# Patient Record
Sex: Female | Born: 2000 | Race: White | Hispanic: No | Marital: Single | Smoking: Never smoker
Health system: Southern US, Community
[De-identification: ages and names within clinical notes are randomized; demographics above are authoritative.]

## PROBLEM LIST (undated history)

## (undated) DIAGNOSIS — R42 Dizziness and giddiness: Secondary | ICD-10-CM

## (undated) DIAGNOSIS — D34 Benign neoplasm of thyroid gland: Secondary | ICD-10-CM

## (undated) DIAGNOSIS — E079 Disorder of thyroid, unspecified: Secondary | ICD-10-CM

## (undated) HISTORY — PX: BIOPSY THYROID: PRO38

---
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
## 2019-01-07 ENCOUNTER — Ambulatory Visit (INDEPENDENT_AMBULATORY_CARE_PROVIDER_SITE_OTHER)
Admission: EM | Admit: 2019-01-07 | Discharge: 2019-01-07 | Disposition: A | Discharge status: Home / Self Care | Payer: Managed Care, Other (non HMO) | Source: Home / Self Care

## 2019-01-07 ENCOUNTER — Emergency Department (HOSPITAL_COMMUNITY)
Admission: EM | Admit: 2019-01-07 | Discharge: 2019-01-07 | Discharge status: Against Medical Advice | Payer: Managed Care, Other (non HMO) | Attending: Emergency Medicine | Admitting: Emergency Medicine

## 2019-01-07 ENCOUNTER — Encounter (HOSPITAL_COMMUNITY): Payer: Self-pay | Admitting: Emergency Medicine

## 2019-01-07 ENCOUNTER — Other Ambulatory Visit: Payer: Self-pay

## 2019-01-07 ENCOUNTER — Encounter (HOSPITAL_COMMUNITY): Payer: Self-pay

## 2019-01-07 DIAGNOSIS — R0789 Other chest pain: Secondary | ICD-10-CM

## 2019-01-07 DIAGNOSIS — Z20828 Contact with and (suspected) exposure to other viral communicable diseases: Secondary | ICD-10-CM

## 2019-01-07 DIAGNOSIS — E079 Disorder of thyroid, unspecified: Secondary | ICD-10-CM | POA: Insufficient documentation

## 2019-01-07 DIAGNOSIS — R070 Pain in throat: Secondary | ICD-10-CM

## 2019-01-07 DIAGNOSIS — J069 Acute upper respiratory infection, unspecified: Secondary | ICD-10-CM | POA: Insufficient documentation

## 2019-01-07 DIAGNOSIS — J029 Acute pharyngitis, unspecified: Secondary | ICD-10-CM | POA: Diagnosis present

## 2019-01-07 DIAGNOSIS — Z5321 Procedure and treatment not carried out due to patient leaving prior to being seen by health care provider: Secondary | ICD-10-CM | POA: Diagnosis not present

## 2019-01-07 HISTORY — DX: Dizziness and giddiness: R42

## 2019-01-07 HISTORY — DX: Benign neoplasm of thyroid gland: D34

## 2019-01-07 LAB — POCT RAPID STREP A: Streptococcus, Group A Screen (Direct): NEGATIVE

## 2019-01-07 MED ORDER — PROMETHAZINE-DM 6.25-15 MG/5ML PO SYRP: 5.0000 mL | ORAL_SOLUTION | Freq: Three times a day (TID) | ORAL | 0 refills | Status: DC | PRN

## 2019-01-07 MED ORDER — BENZONATATE 100 MG PO CAPS: 100.0000 mg | ORAL_CAPSULE | Freq: Three times a day (TID) | ORAL | 0 refills | Status: DC | PRN

## 2019-01-07 NOTE — ED Triage Notes (Signed)
Pt reports sore throat bodyaches for the last week. Reportd sick contacts at home. VSS

## 2019-01-07 NOTE — ED Triage Notes (Signed)
Patient presents to Urgent Care with complaints of sore throat since last night. Patient reports she has also been having generalized body aches.

## 2019-01-07 NOTE — ED Provider Notes (Signed)
MRN: [id number] DOB: [birth year]  Subjective:   Lindsey Soto is a 18 y.o. female presenting for 1 day hx of acute onset mild-moderate worsening malaise, throat pain with difficulty swallowing. Has tried ibuprofen without any relief. Denies any COVID 19 contacts. Denies smoking cigarettes. Denies hx of asthma, allergies.  Of note, patient did have contact with 2 family members that her babies.  They reported to the ER were diagnosed clinically with croup, no COVID-19 testing was done.  No other testing was done per patient.  Patient works as a Engineer, maintenance (IT) and has had a lot of exposure to the general public lately.  No current facility-administered medications for this encounter.   Current Outpatient Medications:  .  meclizine (ANTIVERT) 25 MG tablet, Take 1 tablet (25 mg total) by mouth 3 (three) times daily as needed for dizziness., Disp: 15 tablet, Rfl: 0   No Known Allergies  Past Medical History:  Diagnosis Date  . Thyroid disease   . Thyroid tumor, benign   . Vertigo      Past Surgical History:  Procedure Laterality Date  . BIOPSY THYROID      Review of Systems  Constitutional: Positive for malaise/fatigue. Negative for fever.  HENT: Positive for sore throat. Negative for congestion, ear pain and sinus pain.   Eyes: Negative for blurred vision, double vision, discharge and redness.  Respiratory: Positive for cough. Negative for hemoptysis, shortness of breath and wheezing.   Cardiovascular: Positive for chest pain (rib pain with deep breath and coughing).  Gastrointestinal: Negative for abdominal pain, diarrhea, nausea and vomiting.  Genitourinary: Negative for dysuria, flank pain and hematuria.  Musculoskeletal: Positive for myalgias.  Skin: Negative for rash.  Neurological: Negative for dizziness, weakness and headaches.  Psychiatric/Behavioral: Negative for depression and substance abuse.    Objective:   Vitals: BP 110/75 (BP Location: Right Arm)    Pulse 74   Temp 98.6 F (37 C) (Oral)   Resp 17   LMP 12/24/2018 Comment: Nexplanon plced 2018  SpO2 99%   Physical Exam Constitutional:      General: She is not in acute distress.    Appearance: Normal appearance. She is well-developed. She is not ill-appearing, toxic-appearing or diaphoretic.  HENT:     Head: Normocephalic and atraumatic.     Right Ear: Tympanic membrane and ear canal normal. No drainage or tenderness. No middle ear effusion. Tympanic membrane is not erythematous.     Left Ear: Tympanic membrane and ear canal normal. No drainage or tenderness.  No middle ear effusion. Tympanic membrane is not erythematous.     Nose: Congestion and rhinorrhea present.     Mouth/Throat:     Mouth: Mucous membranes are moist. No oral lesions.     Pharynx: No pharyngeal swelling, oropharyngeal exudate, posterior oropharyngeal erythema or uvula swelling.     Tonsils: No tonsillar exudate or tonsillar abscesses.  Eyes:     Extraocular Movements: Extraocular movements intact.     Right eye: Normal extraocular motion.     Left eye: Normal extraocular motion.     Conjunctiva/sclera: Conjunctivae normal.     Pupils: Pupils are equal, round, and reactive to light.  Neck:     Musculoskeletal: Normal range of motion and neck supple.  Cardiovascular:     Rate and Rhythm: Normal rate and regular rhythm.     Pulses: Normal pulses.     Heart sounds: Normal heart sounds. No murmur. No friction rub. No gallop.   Pulmonary:  Effort: Pulmonary effort is normal. No respiratory distress.     Breath sounds: Normal breath sounds. No stridor. No wheezing, rhonchi or rales.  Lymphadenopathy:     Cervical: No cervical adenopathy.  Skin:    General: Skin is warm and dry.     Findings: No rash.  Neurological:     General: No focal deficit present.     Mental Status: She is alert and oriented to person, place, and time.  Psychiatric:        Mood and Affect: Mood normal.        Behavior: Behavior  normal.        Thought Content: Thought content normal.     Negative rapid strep by verbal report.    Assessment and Plan :   1. Viral URI with cough   2. Atypical chest pain   3. Throat pain     Will manage for viral illness. Counseled patient on nature of COVID-19 including modes of transmission, diagnostic testing, management and supportive care.  Offered symptomatic relief. COVID 19 testing is pending. Counseled patient on potential for adverse effects with medications prescribed/recommended today, ER and return-to-clinic precautions discussed, patient verbalized understanding.     Jaynee Eagles, [place] 01/07/19 1649

## 2019-01-07 NOTE — Discharge Instructions (Signed)
We will manage this as a viral syndrome. For sore throat or cough try using a honey-based tea. Use 3 teaspoons of honey with juice squeezed from half lemon. Place shaved pieces of ginger into 1/2-1 cup of water and warm over stove top. Then mix the ingredients and repeat every 4 hours as needed. Please take Tylenol 500mg  every 6 hours. Hydrate very well with at least 2 liters of water. Eat light meals such as soups to replenish electrolytes and soft fruits, veggies. Start an antihistamine like Zyrtec, Allegra or Claritin for postnasal drainage, sinus congestion.  You can take this together with pseudoephedrine (Sudafed) at a dose of 60 mg 3 times a day oral 120 mg twice daily as needed for the same kind of congestion.

## 2019-01-07 NOTE — ED Notes (Signed)
Pt wants to leave. Encouraged to stay and see a provider. Asked to stay. Pt refuses wants to go elsewhere. Labels and wristband retrieved.

## 2019-01-09 LAB — CULTURE, GROUP A STREP (THRC)

## 2019-01-10 LAB — NOVEL CORONAVIRUS, NAA (HOSP ORDER, SEND-OUT TO REF LAB; TAT 18-24 HRS): SARS-CoV-2, NAA: NOT DETECTED

## 2019-04-03 IMAGING — CT CT HEAD W/O CM
3 series · 15 of 47 positions shown, 18 images · non-contrast
Comparison: None.

CLINICAL DATA: Ataxia.

EXAM:
CT HEAD WITHOUT CONTRAST
TECHNIQUE: Contiguous axial images were obtained from the base of the skull
through the vertex without intravenous contrast.

[Series 2: head wo · axial · 0.40mm/px · z∈[-159,-34]mm · 9 of 30 slices shown, 12 images]
[im 3/30  brain]
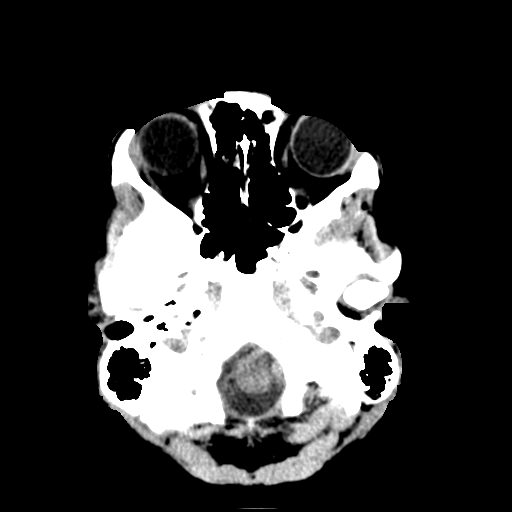
[im 3/30  bone]
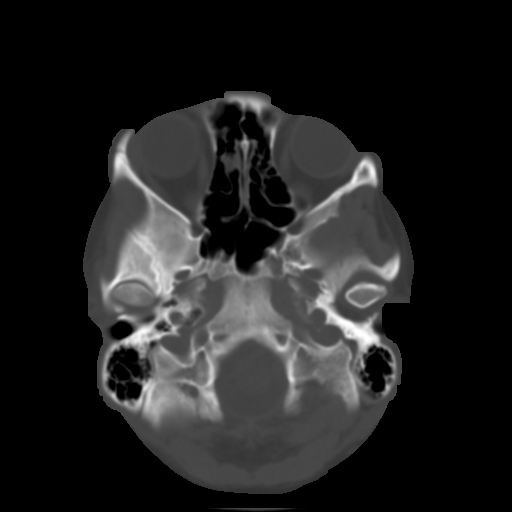
[im 6/30  brain]
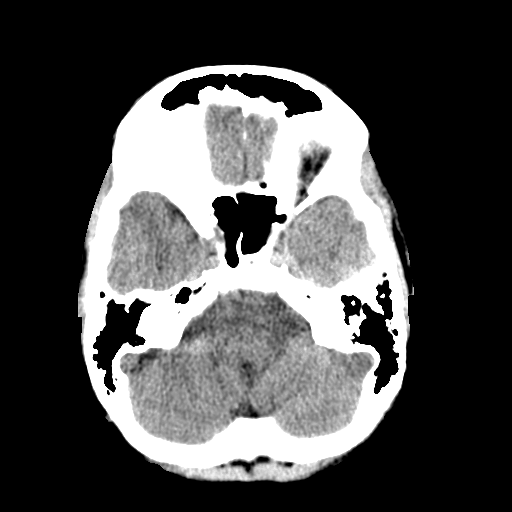
[im 9/30  brain]
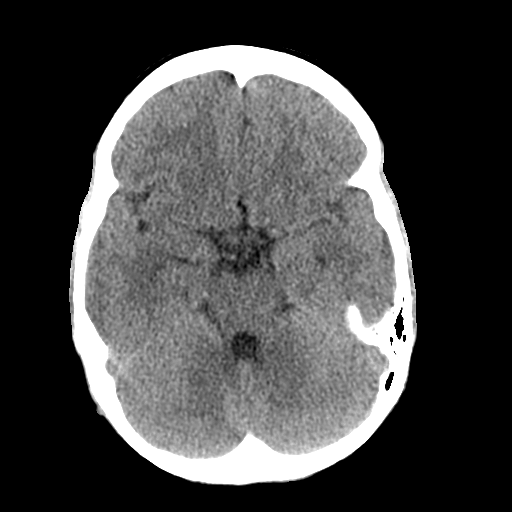
[im 12/30  brain]
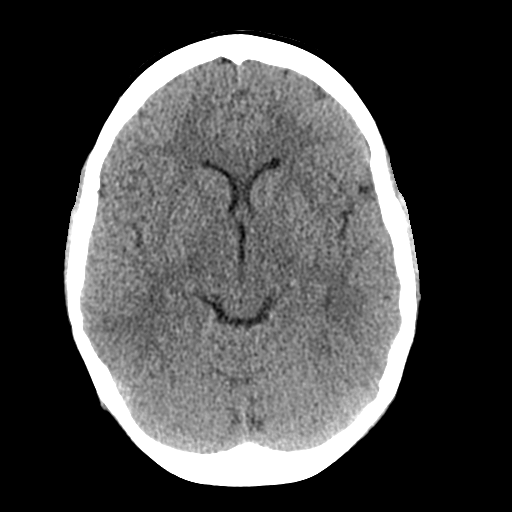
[im 16/30  brain]
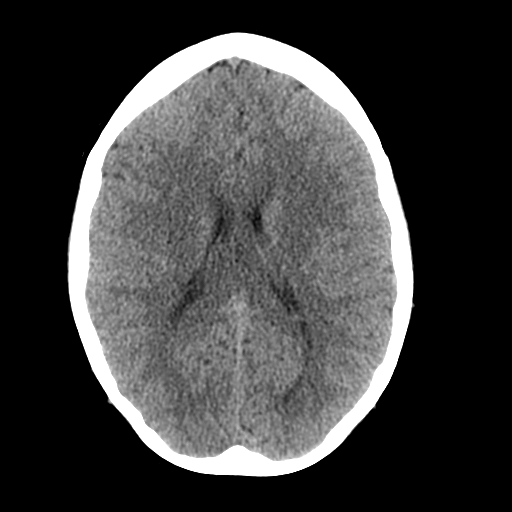
[im 16/30  bone]
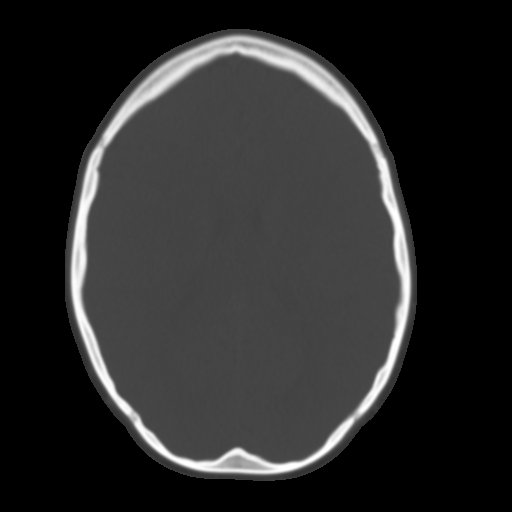
[im 19/30  brain]
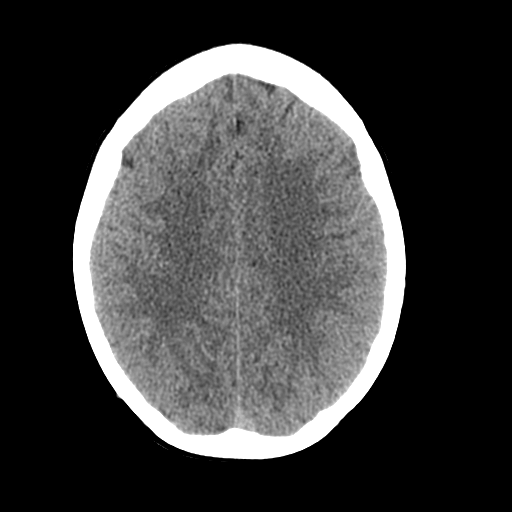
[im 22/30  brain]
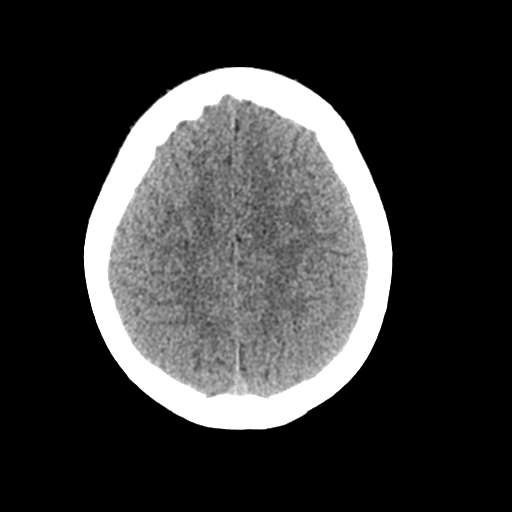
[im 25/30  brain]
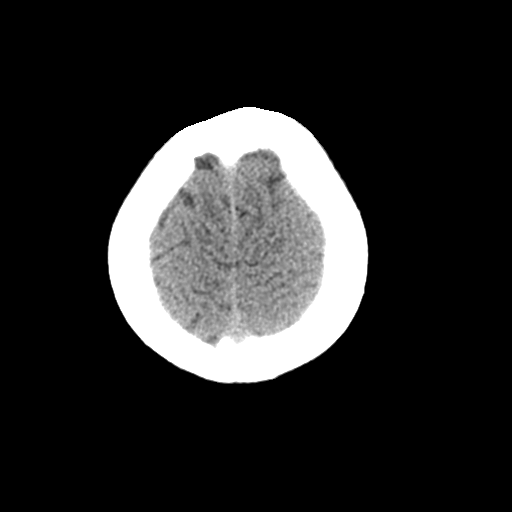
[im 28/30  brain]
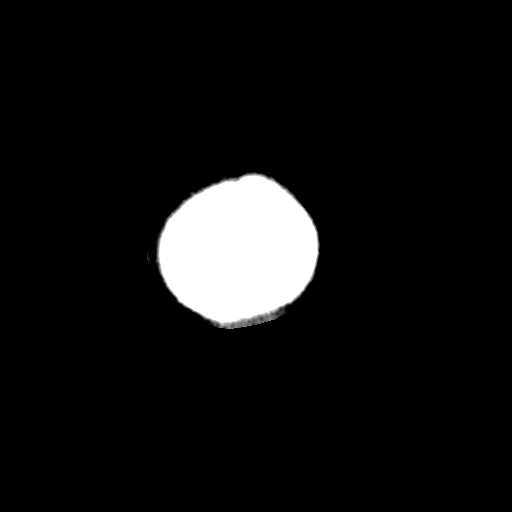
[im 28/30  bone]
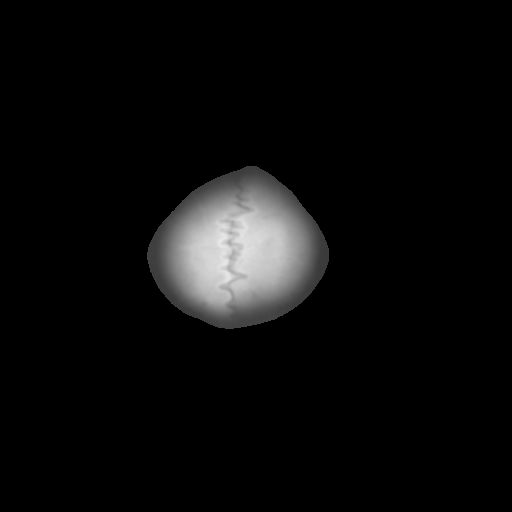

[Series 5: coronal soft tissue · coronal · 0.29mm/px · 3 of 71 slices shown]
[im 24/71  brain]
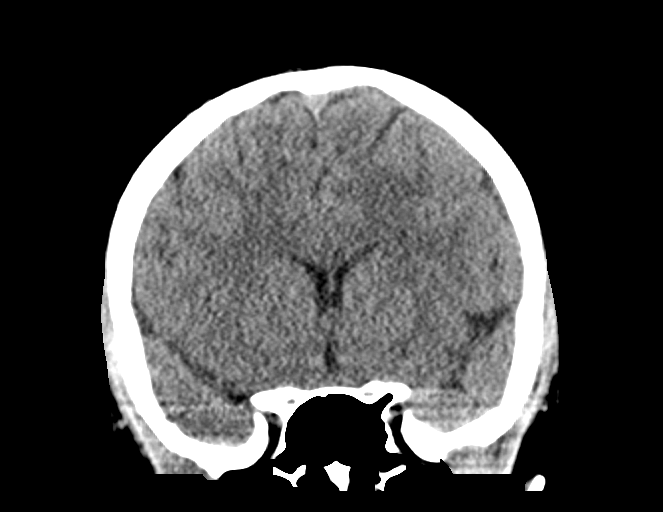
[im 32/71  brain]
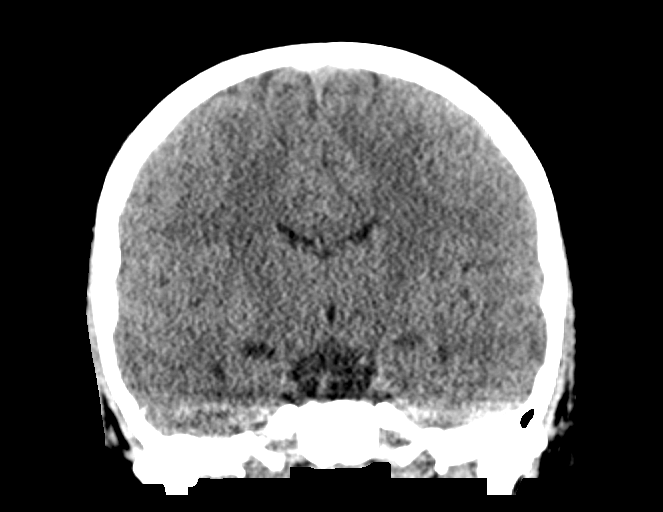
[im 39/71  brain]
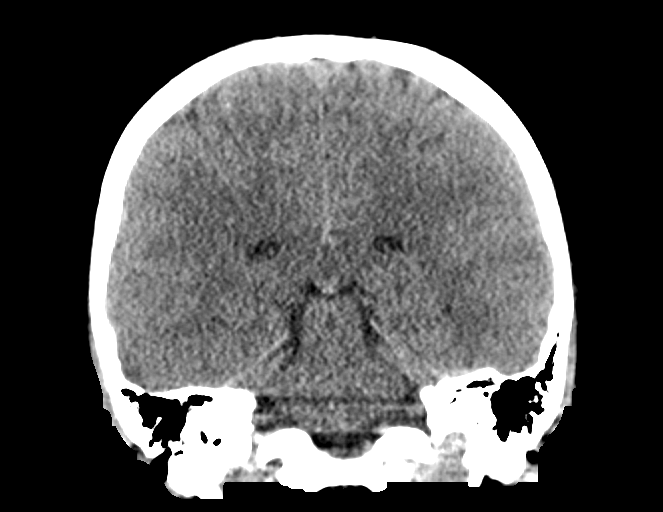

[Series 6: sagittal soft tissue · sagittal · 0.29mm/px · 3 of 66 slices shown]
[im 22/66  brain]
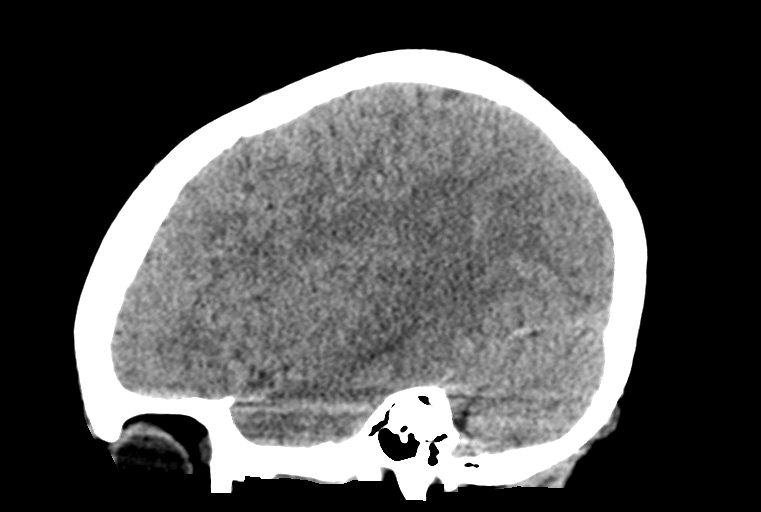
[im 33/66  brain]
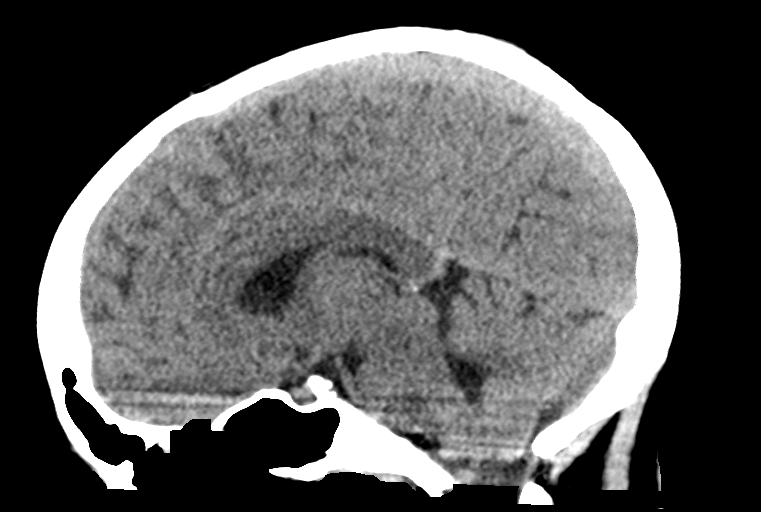
[im 44/66  brain]
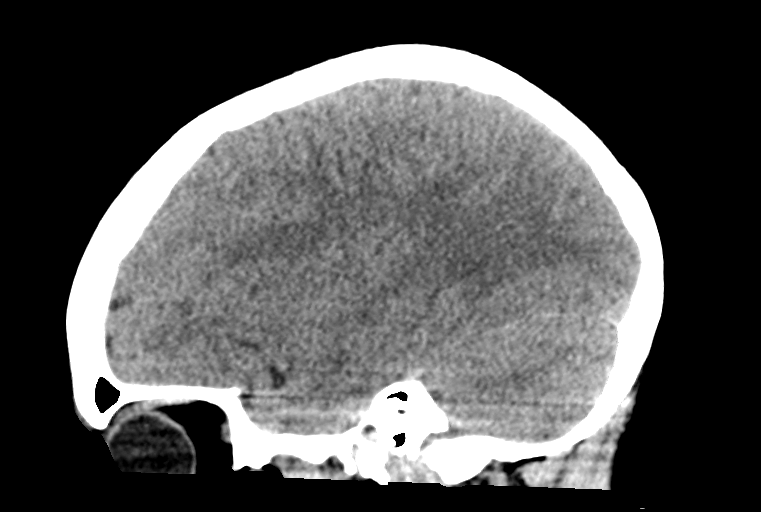

[15 of 47 positions shown; findings below may reference images not displayed]

FINDINGS: Brain: No acute intracranial abnormality. Specifically, no
hemorrhage, hydrocephalus, mass lesion, acute infarction, or
significant intracranial injury.

Vascular: No hyperdense vessel or unexpected calcification.

Skull: No acute calvarial abnormality.

Sinuses/Orbits: Visualized paranasal sinuses and mastoids clear.
Orbital soft tissues unremarkable.

Other: None
IMPRESSION: Normal study.

## 2023-05-13 ENCOUNTER — Other Ambulatory Visit: Payer: Self-pay

## 2023-05-13 ENCOUNTER — Encounter (HOSPITAL_BASED_OUTPATIENT_CLINIC_OR_DEPARTMENT_OTHER): Payer: Self-pay | Admitting: Emergency Medicine

## 2023-05-13 ENCOUNTER — Emergency Department (HOSPITAL_BASED_OUTPATIENT_CLINIC_OR_DEPARTMENT_OTHER)

## 2023-05-13 ENCOUNTER — Emergency Department (HOSPITAL_BASED_OUTPATIENT_CLINIC_OR_DEPARTMENT_OTHER)
Admission: EM | Admit: 2023-05-13 | Discharge: 2023-05-14 | Disposition: A | Discharge status: Home / Self Care | Attending: Emergency Medicine | Admitting: Emergency Medicine

## 2023-05-13 DIAGNOSIS — N760 Acute vaginitis: Secondary | ICD-10-CM | POA: Insufficient documentation

## 2023-05-13 DIAGNOSIS — K921 Melena: Secondary | ICD-10-CM | POA: Insufficient documentation

## 2023-05-13 DIAGNOSIS — K6289 Other specified diseases of anus and rectum: Secondary | ICD-10-CM | POA: Diagnosis present

## 2023-05-13 DIAGNOSIS — B9689 Other specified bacterial agents as the cause of diseases classified elsewhere: Secondary | ICD-10-CM | POA: Insufficient documentation

## 2023-05-13 DIAGNOSIS — N83201 Unspecified ovarian cyst, right side: Secondary | ICD-10-CM | POA: Diagnosis not present

## 2023-05-13 DIAGNOSIS — Z79899 Other long term (current) drug therapy: Secondary | ICD-10-CM | POA: Insufficient documentation

## 2023-05-13 DIAGNOSIS — K625 Hemorrhage of anus and rectum: Secondary | ICD-10-CM

## 2023-05-13 LAB — COMPREHENSIVE METABOLIC PANEL
ALT: 11 U/L (ref 0–44)
AST: 15 U/L (ref 15–41)
Albumin: 5 g/dL (ref 3.5–5.0)
Alkaline Phosphatase: 54 U/L (ref 38–126)
Anion gap: 8 (ref 5–15)
BUN: 12 mg/dL (ref 6–20)
CO2: 28 mmol/L (ref 22–32)
Calcium: 9.4 mg/dL (ref 8.9–10.3)
Chloride: 102 mmol/L (ref 98–111)
Creatinine, Ser: 0.78 mg/dL (ref 0.44–1.00)
GFR, Estimated: >60 mL/min (ref >60)
Glucose, Bld: 99 mg/dL (ref 70–99)
Potassium: 4.1 mmol/L (ref 3.5–5.1)
Sodium: 138 mmol/L (ref 135–145)
Total Bilirubin: 0.5 mg/dL (ref 0.0–1.2)
Total Protein: 7.7 g/dL (ref 6.5–8.1)

## 2023-05-13 LAB — CBC
HCT: 41.2 % (ref 36.0–46.0)
Hemoglobin: 13.5 g/dL (ref 12.0–15.0)
MCH: 27.9 pg (ref 26.0–34.0)
MCHC: 32.8 g/dL (ref 30.0–36.0)
MCV: 85.1 fL (ref 80.0–100.0)
Platelets: 320 10*3/uL (ref 150–400)
RBC: 4.84 MIL/uL (ref 3.87–5.11)
RDW: 13.1 % (ref 11.5–15.5)
WBC: 11.3 10*3/uL — ABNORMAL HIGH (ref 4.0–10.5)
nRBC: 0 % (ref 0.0–0.2)

## 2023-05-13 LAB — PREGNANCY, URINE: Preg Test, Ur: NEGATIVE

## 2023-05-13 MED ORDER — IOHEXOL 300 MG/ML  SOLN
100.0000 mL | Freq: Once | INTRAMUSCULAR | Status: AC | PRN
  Administered 2023-05-13: 85 mL via INTRAVENOUS

## 2023-05-13 MED ORDER — OXYCODONE-ACETAMINOPHEN 5-325 MG PO TABS
1.0000 | ORAL_TABLET | Freq: Once | ORAL | Status: AC
  Administered 2023-05-13: 1 via ORAL
  Filled 2023-05-13: qty 1

## 2023-05-13 MED ORDER — ONDANSETRON 4 MG PO TBDP
4.0000 mg | ORAL_TABLET | Freq: Once | ORAL | Status: AC
  Administered 2023-05-13: 4 mg via ORAL
  Filled 2023-05-13: qty 1

## 2023-05-13 MED ORDER — KETOROLAC TROMETHAMINE 15 MG/ML IJ SOLN
15.0000 mg | Freq: Once | INTRAMUSCULAR | Status: AC
  Administered 2023-05-13: 15 mg via INTRAVENOUS
  Filled 2023-05-13: qty 1

## 2023-05-13 NOTE — ED Provider Notes (Signed)
 [place] EMERGENCY DEPARTMENT AT [facility] Provider Note   CSN: [id number] Arrival date & time: 05/13/23  1619     History  Chief Complaint  Patient presents with   Rectal Bleeding    Lindsey Soto is a 23 y.o. female.  Patient is a 23 year old female presenting for complaints of throbbing pain and fullness in the rectum and perineum with rectal bleeding x 3 weeks.  Patient states she had a hernia due to the blood in her stool and toilet when attempting to have a bowel movement.  She states she was unable to visualize an external hernia but was treating herself with hydrocortisone cream.  Patient states the perineal pain and pressure has gotten so severe that she is unable to have complete bowel movements.  She states her bowel movements are soft and she is never struggled with constipation but the pain is so severe that she is withholding and going to the bathroom and small doses due to pain.  She denies vaginal bleeding or vaginal symptoms.  Denies abdominal pain, nausea, vomiting, fevers.  The history is provided by the patient. No language interpreter was used.  Rectal Bleeding Associated symptoms: no abdominal pain, no fever and no vomiting        Home Medications Prior to Admission medications   Medication Sig Start Date End Date Taking? Authorizing Provider  benzonatate (TESSALON) 100 MG capsule Take 1-2 capsules (100-200 mg total) by mouth 3 (three) times daily as needed. 01/07/19   Wallis Bamberg, PA-C  meclizine (ANTIVERT) 25 MG tablet Take 1 tablet (25 mg total) by mouth 3 (three) times daily as needed for dizziness. 08/26/17   Weber, Dema Severin, PA-C  promethazine-dextromethorphan (PROMETHAZINE-DM) 6.25-15 MG/5ML syrup Take 5 mLs by mouth 3 (three) times daily as needed for cough. 01/07/19   Wallis Bamberg, PA-C      Allergies    Patient has no known allergies.    Review of Systems   Review of Systems  Constitutional:  Negative for chills and fever.  HENT:   Negative for ear pain and sore throat.   Eyes:  Negative for pain and visual disturbance.  Respiratory:  Negative for cough and shortness of breath.   Cardiovascular:  Negative for chest pain and palpitations.  Gastrointestinal:  Positive for hematochezia. Negative for abdominal pain and vomiting.  Genitourinary:  Negative for dysuria, hematuria, vaginal bleeding, vaginal discharge and vaginal pain.  Musculoskeletal:  Negative for arthralgias and back pain.  Skin:  Negative for color change and rash.  Neurological:  Negative for seizures and syncope.  All other systems reviewed and are negative.   Physical Exam Updated Vital Signs BP 121/82   Pulse (!) 104   Temp 98.4 F (36.9 C) (Oral)   Resp 15   Wt 68 kg   SpO2 100%   BMI 27.44 kg/m  Physical Exam Vitals and nursing note reviewed. Exam conducted with a chaperone present.  Constitutional:      General: She is not in acute distress.    Appearance: She is well-developed.  HENT:     Head: Normocephalic and atraumatic.  Eyes:     Conjunctiva/sclera: Conjunctivae normal.  Cardiovascular:     Rate and Rhythm: Normal rate and regular rhythm.     Heart sounds: No murmur heard. Pulmonary:     Effort: Pulmonary effort is normal. No respiratory distress.     Breath sounds: Normal breath sounds.  Abdominal:     Palpations: Abdomen is soft.  Tenderness: There is no abdominal tenderness.  Genitourinary:    General: Normal vulva.     Labia:        Right: No rash, tenderness, lesion or injury.        Left: No rash, tenderness, lesion or injury.      Rectum: Tenderness present. No mass, anal fissure, external hemorrhoid or internal hemorrhoid. Normal anal tone.       Comments: Digital rectal exam negative for gross blood Musculoskeletal:        General: No swelling.     Cervical back: Neck supple.  Skin:    General: Skin is warm and dry.     Capillary Refill: Capillary refill takes less than 2 seconds.  Neurological:      Mental Status: She is alert.  Psychiatric:        Mood and Affect: Mood normal.     ED Results / Procedures / Treatments   Labs (all labs ordered are listed, but only abnormal results are displayed) Labs Reviewed  CBC - Abnormal; Notable for the following components:      Result Value   WBC 11.3 (*)    All other components within normal limits  COMPREHENSIVE METABOLIC PANEL  PREGNANCY, URINE    EKG None  Radiology No results found.  Procedures Procedures    Medications Ordered in ED Medications  ketorolac (TORADOL) 15 MG/ML injection 15 mg (15 mg Intravenous Given 05/13/23 2321)  oxyCODONE-acetaminophen (PERCOCET/ROXICET) 5-325 MG per tablet 1 tablet (1 tablet Oral Given 05/13/23 2316)  ondansetron (ZOFRAN-ODT) disintegrating tablet 4 mg (4 mg Oral Given 05/13/23 2317)    ED Course/ Medical Decision Making/ A&P                                 Medical Decision Making Amount and/or Complexity of Data Reviewed Labs: ordered. Radiology: ordered.  Risk Prescription drug management.   50:56 PM 23 year old female presenting for complaints of throbbing pain and fullness in the rectum and perineum with rectal bleeding x 3 weeks.  Is alert oriented x 3, no acute distress, afebrile, stable to signs.  Physical exam demonstrates pain on digital rectal exam.  No gross blood.  No external or internal hemorrhoids visualized.  No rectal fissures.  Patient does have fullness and tenderness in the perineum.  [place] diagnosis at this time includes but is not limited to fecal bolus causing pain and pressure with possible internal hemorrhoid causing bleeding, rectal or ischial anal abscess, etc.  Laboratory studies concerning for leukocytosis at 11.3.  CT abdomen and pelvis pending.  Patient given for pain control.  Patient signed out to oncoming physician while awaiting CT results.          Final Clinical Impression(s) / ED Diagnoses Final diagnoses:  Rectal pain  Blood in  stool  Rectal bleeding    Rx / DC Orders ED Discharge Orders     None         Franne Forts, DO 05/13/23 2326

## 2023-05-13 NOTE — ED Triage Notes (Signed)
 Rectal pain, bleeding. "I can get a whole BM out" Throbbing pain, hurts to walk Some blood in underwear X 3 weeks

## 2023-05-14 ENCOUNTER — Emergency Department (HOSPITAL_BASED_OUTPATIENT_CLINIC_OR_DEPARTMENT_OTHER)

## 2023-05-14 LAB — WET PREP, GENITAL
Sperm: NONE SEEN
Trich, Wet Prep: NONE SEEN
WBC, Wet Prep HPF POC: <10 (ref <10)
Yeast Wet Prep HPF POC: NONE SEEN

## 2023-05-14 LAB — HEMOGLOBIN AND HEMATOCRIT, BLOOD
HCT: 38.3 % (ref 36.0–46.0)
Hemoglobin: 12.8 g/dL (ref 12.0–15.0)

## 2023-05-14 MED ORDER — ONDANSETRON HCL 4 MG/2ML IJ SOLN
4.0000 mg | Freq: Once | INTRAMUSCULAR | Status: AC
  Administered 2023-05-14: 4 mg via INTRAVENOUS

## 2023-05-14 MED ORDER — ONDANSETRON HCL 4 MG/2ML IJ SOLN
INTRAMUSCULAR | Status: AC
  Filled 2023-05-14: qty 2

## 2023-05-14 MED ORDER — HYDROMORPHONE HCL 1 MG/ML IJ SOLN
1.0000 mg | Freq: Once | INTRAMUSCULAR | Status: AC
  Administered 2023-05-14: 1 mg via INTRAVENOUS
  Filled 2023-05-14: qty 1

## 2023-05-14 MED ORDER — HYDROCORTISONE (PERIANAL) 2.5 % EX CREA: 1.0000 | TOPICAL_CREAM | Freq: Two times a day (BID) | CUTANEOUS | 0 refills | Status: AC

## 2023-05-14 MED ORDER — METRONIDAZOLE 500 MG PO TABS: 500.0000 mg | ORAL_TABLET | Freq: Two times a day (BID) | ORAL | 0 refills | Status: AC

## 2023-05-14 NOTE — ED Provider Notes (Signed)
 Care assumed from Dr. Wallace Cullens.  Patient here with fullness to her perineum and rectum with rectal bleeding x 3 weeks.  No hemorrhoids visualized.  Pending CT scan.  IMPRESSION: 1. 5.2 cm x 3.8 cm x 5.2 cm simple cyst within the posterior aspect of the right adnexa. Correlation with nonemergent pelvic ultrasound is recommended. This recommendation follows ACR consensus guidelines: White Paper of the ACR Incidental Findings Committee II on Adnexal Findings. J Am Coll Radiol [phone]. 2. Findings likely consistent with small hepatic cysts versus hemangiomas. 3. No evidence of a perirectal fluid collection or abscess. GI consultation is recommended if clinical symptoms persist.  Pelvic exam performed with Marijean Niemann EMT as chaperone.  Normal external genitalia.  White discharge in vaginal vault.  No lateralized adnexal tenderness or CMT.  Repeat hemoglobin stable. Low suspicion for significant GI bleed.   US shows complex R ovarian cyst with daughter cyst component. No evidence of ovarian torsion. Repeat US in 3-6 months recommended.   Results d/w patient and mother. She feels improved and is able to urinate on her own. Declines foley catheter, has about 300 mL on bladder scan.   Query whether large cyst is causing her discomfort.  But would not explain her rectal bleeding.   Followup with both GI and GYN. Anusol suppositories to be given for possibility of internal hemorrhoids.   Return precautions discussed.    Glynn Octave, MD 05/14/23 [phone]

## 2023-05-14 NOTE — Discharge Instructions (Addendum)
 Followup with your gynecologist and gastroenterologist. You should have a repeat ultrasound in 3-6 months. Take the antibiotics as prescribed.  Return to the ED if you develop new worsening symptoms.

## 2023-05-14 NOTE — ED Notes (Signed)
 Dc instructions reviewed with patient. Patient voiced understanding. Dc with belongings.

## 2023-05-14 NOTE — ED Notes (Signed)
 Pt states she has had rectal bleeding x [redacted] weeks along with pain and fullness in rectum and perineum.  States she is unable to have a complete BM "because it will not all pass" Unable to insert suppository today.

## 2023-05-15 LAB — GC/CHLAMYDIA PROBE AMP (~~LOC~~) NOT AT ARMC
Chlamydia: NEGATIVE
Comment: NEGATIVE
Comment: NORMAL
Neisseria Gonorrhea: NEGATIVE

## 2023-05-28 NOTE — Progress Notes (Signed)
 Surgical Instructions   Your procedure is scheduled on Thursday May 30, 2023. Report to [facility] Main Entrance "A" at 5:30 A.M., then check in with the Admitting office. Any questions or running late day of surgery: call [phone]  Questions prior to your surgery date: call [phone], Monday-Friday, 8am-4pm. If you experience any cold or flu symptoms such as cough, fever, chills, shortness of breath, etc. between now and your scheduled surgery, please notify us at the above number.     Remember:  Do not eat or drink after midnight the night before your surgery  Take these medicines the morning of surgery with A SIP OF WATER: None  PLEASE STOP YOUR phentermine IMMEDIATELY.   One week prior to surgery, STOP taking any Aspirin (unless otherwise instructed by your surgeon) Aleve, Naproxen, Ibuprofen, Motrin, Advil, Goody's, BC's, all herbal medications, fish oil, and non-prescription vitamins.                     Do NOT Smoke (Tobacco/Vaping) for 24 hours prior to your procedure.  If you use a CPAP at night, you may bring your mask/headgear for your overnight stay.   You will be asked to remove any contacts, glasses, piercing's, hearing aid's, dentures/partials prior to surgery. Please bring cases for these items if needed.    Patients discharged the day of surgery will not be allowed to drive home, and someone needs to stay with them for 24 hours.  SURGICAL WAITING ROOM VISITATION Patients may have no more than 2 support people in the waiting area - these visitors may rotate.   Pre-op nurse will coordinate an appropriate time for 1 ADULT support person, who may not rotate, to accompany patient in pre-op.  Children under the age of 64 must have an adult with them who is not the patient and must remain in the main waiting area with an adult.  If the patient needs to stay at the hospital during part of their recovery, the visitor guidelines for inpatient rooms apply.  Please  refer to the [facility] website for the visitor guidelines for any additional information.   If you received a COVID test during your pre-op visit  it is requested that you wear a mask when out in public, stay away from anyone that may not be feeling well and notify your surgeon if you develop symptoms. If you have been in contact with anyone that has tested positive in the last 10 days please notify you surgeon.      Pre-operative CHG Bathing Instructions   You can play a key role in reducing the risk of infection after surgery. Your skin needs to be as free of germs as possible. You can reduce the number of germs on your skin by washing with CHG (chlorhexidine gluconate) soap before surgery. CHG is an antiseptic soap that kills germs and continues to kill germs even after washing.   DO NOT use if you have an allergy to chlorhexidine/CHG or antibacterial soaps. If your skin becomes reddened or irritated, stop using the CHG and notify one of our RNs at [phone].              TAKE A SHOWER THE NIGHT BEFORE SURGERY AND THE DAY OF SURGERY    Please keep in mind the following:  DO NOT shave, including legs and underarms, 48 hours prior to surgery.   You may shave your face before/day of surgery.  Place clean sheets on your bed the night  before surgery Use a clean washcloth (not used since being washed) for each shower. DO NOT sleep with pet's night before surgery.  CHG Shower Instructions:  Wash your face and private area with normal soap. If you choose to wash your hair, wash first with your normal shampoo.  After you use shampoo/soap, rinse your hair and body thoroughly to remove shampoo/soap residue.  Turn the water OFF and apply half the bottle of CHG soap to a CLEAN washcloth.  Apply CHG soap ONLY FROM YOUR NECK DOWN TO YOUR TOES (washing for 3-5 minutes)  DO NOT use CHG soap on face, private areas, open wounds, or sores.  Pay special attention to the area where your surgery is  being performed.  If you are having back surgery, having someone wash your back for you may be helpful. Wait 2 minutes after CHG soap is applied, then you may rinse off the CHG soap.  Pat dry with a clean towel  Put on clean pajamas    Additional instructions for the day of surgery: DO NOT APPLY any lotions, deodorants or perfumes.   Do not wear jewelry or makeup Do not wear nail polish, gel polish, artificial nails, or any other type of covering on natural nails (fingers and toes) Do not bring valuables to the hospital. [facility] is not responsible for valuables/personal belongings. Put on clean/comfortable clothes.  Please brush your teeth.  Ask your nurse before applying any prescription medications to the skin.

## 2023-05-29 ENCOUNTER — Inpatient Hospital Stay (HOSPITAL_COMMUNITY)
Admission: RE | Admit: 2023-05-29 | Discharge: 2023-05-29 | Disposition: A | Discharge status: Home / Self Care | Source: Ambulatory Visit

## 2023-05-30 ENCOUNTER — Ambulatory Visit: Admit: 2023-05-30 | Admitting: Obstetrics and Gynecology

## 2023-05-30 DIAGNOSIS — N83201 Unspecified ovarian cyst, right side: Secondary | ICD-10-CM

## 2023-05-30 SURGERY — EXCISION, CYST, OVARY, LAPAROSCOPIC
Anesthesia: General | Laterality: Right
